# Patient Record
Sex: Female | Born: 1995 | Race: White | Hispanic: No | Marital: Single | Smoking: Never smoker
Health system: Southern US, Community
[De-identification: ages and names within clinical notes are randomized; demographics above are authoritative.]

---
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
## 2015-01-17 DIAGNOSIS — M722 Plantar fascial fibromatosis: Secondary | ICD-10-CM | POA: Diagnosis not present

## 2015-05-05 DIAGNOSIS — J069 Acute upper respiratory infection, unspecified: Secondary | ICD-10-CM | POA: Diagnosis not present

## 2015-12-30 ENCOUNTER — Encounter: Payer: Self-pay | Admitting: Family Medicine

## 2015-12-30 ENCOUNTER — Ambulatory Visit (INDEPENDENT_AMBULATORY_CARE_PROVIDER_SITE_OTHER): Payer: BLUE CROSS/BLUE SHIELD | Admitting: Family Medicine

## 2015-12-30 VITALS — BP 90/52 | HR 70 | Temp 96.3°F | Resp 14

## 2015-12-30 DIAGNOSIS — R5383 Other fatigue: Secondary | ICD-10-CM

## 2015-12-30 NOTE — Progress Notes (Signed)
Patient presents today with two episodes of exhaustion that brought patient to her knees in the last two races with cross-country. She has not ran in a race since high school. She did not compete last year because of a plantar fascia injury. Patient did not lose consciousness. The first episode there was not a physician to evaluate the patient however the second episode there was. Patient states that the first episode was an intense race. She has never ran a race like that before. She was about 100 m from the finish line when she states she started to hyperventilate and felt exhaustion. She was unable to complete the race. She believes a second episode involved a race that was longer and she felt short of the finish line by 1 kilometer. She also did not lose consciousness with this episode. The physicians there evaluated her and stated that she had some minimal wheezing on exam. Patient typically uses her inhaler before practices and races. Her vitals were stable. Patient was able to walk after 5 minutes after both episodes. She had no lasting problems after this. She denied any chest pain, dizziness, headache, increased shortness of breath during the episodes. She denies any lower extremity swelling or pain. She states that she had no similar episodes in practice ever before or in high school running cross-country. She does not eat much before race and has always done so. She drinks 0-calorie electrolyte beverages. She would say that she gets slightly anxious during the race. She has started to talk to a sports psychologist on campus which she states has helped. She denies any family history of sudden cardiac death. She denies any illicit drug use. She denies using any supplements.  ROS: Negative except mentioned above.  GENERAL: NAD RESP: CTA B CARD: RRR NEURO: CN II-XII grossly intact   ECG - NSR, no ST elevation or depressions, Pwaves present  A/P: Exhaustion during races causing collapse -not truly  syncopal episode was described, I would recommend optimizing her diet prior to racing, would continue seeing the sports psychologist, would also encourage drinking electrolyte beverages with calories, EKG today in office was reassuring showing normal sinus rhythm. Given the fact that she has never had an episode like this before in high school or during practices I would continue with the plan above. If she has another episode in practice or during a race I will refer her to cardiology for further workup. Patient addresses understanding of plan.

## 2016-01-27 ENCOUNTER — Other Ambulatory Visit: Payer: Self-pay | Admitting: Family Medicine

## 2016-12-06 ENCOUNTER — Encounter: Payer: Self-pay | Admitting: Family Medicine

## 2016-12-06 ENCOUNTER — Ambulatory Visit
Admission: RE | Admit: 2016-12-06 | Discharge: 2016-12-06 | Disposition: A | Discharge status: Home / Self Care | Payer: BLUE CROSS/BLUE SHIELD | Source: Ambulatory Visit | Attending: Family Medicine | Admitting: Family Medicine

## 2016-12-06 ENCOUNTER — Ambulatory Visit (INDEPENDENT_AMBULATORY_CARE_PROVIDER_SITE_OTHER): Payer: BLUE CROSS/BLUE SHIELD | Admitting: Family Medicine

## 2016-12-06 DIAGNOSIS — M79671 Pain in right foot: Secondary | ICD-10-CM | POA: Diagnosis not present

## 2016-12-06 MED ORDER — NAPROXEN 500 MG PO TABS: 500.0000 mg | ORAL_TABLET | Freq: Two times a day (BID) | ORAL | 0 refills | Status: DC

## 2016-12-06 NOTE — Progress Notes (Signed)
Patient presents with right ankle/heel pain for about 3 weeks. She states that her mileage hasn't really gone up much since the summer. What has changed is the uneven terrain she has been running on since she got back to school. She has been changing out her running shoes every 300-400 miles. She is currently up to 7760miles/week. Her most recent Vitamin D level was normal this summer. She is currently not taking a Vitamin D supplement. She has had some pain at rest. She has been doing stretching and scraping with PT.   ROS: Negative except mentioned above. Vitals as per Epic.  GENERAL: NAD RESP: CTA B CARD: RRR MSK: R Ankle/Foot - no ecchymosis or obvious swelling of ankle/foot, FROM, no significant tenderness of peroneal tendon appreciated, mild tenderness along lateral aspect of calcaneus, mild tenderness with squeeze, mild tenderness with heel walk and hopping, nv intact  NEURO: CN II-XII grossly intact  A/P: R foot pain - will do xrays and MRI to further look for stress injury, walking boot for now, if pain with weightbearing in boot then on crutches, cross-training if no pain, Naprosyn prn, seek medical attention if symptoms persist or worsen.

## 2016-12-07 ENCOUNTER — Other Ambulatory Visit: Payer: Self-pay | Admitting: Family Medicine

## 2016-12-07 DIAGNOSIS — M79671 Pain in right foot: Secondary | ICD-10-CM

## 2016-12-09 ENCOUNTER — Ambulatory Visit: Admission: RE | Admit: 2016-12-09 | Payer: BLUE CROSS/BLUE SHIELD | Source: Ambulatory Visit

## 2016-12-13 ENCOUNTER — Ambulatory Visit (INDEPENDENT_AMBULATORY_CARE_PROVIDER_SITE_OTHER): Payer: BLUE CROSS/BLUE SHIELD | Admitting: Family Medicine

## 2016-12-13 DIAGNOSIS — F4329 Adjustment disorder with other symptoms: Secondary | ICD-10-CM
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
## 2017-01-17 ENCOUNTER — Other Ambulatory Visit: Payer: Self-pay | Admitting: Family Medicine

## 2017-01-17 NOTE — Progress Notes (Signed)
Refilled Naprosyn 

## 2017-01-31 ENCOUNTER — Other Ambulatory Visit: Payer: Self-pay | Admitting: Family Medicine

## 2017-05-24 ENCOUNTER — Ambulatory Visit
Admission: RE | Admit: 2017-05-24 | Discharge: 2017-05-24 | Disposition: A | Discharge status: Home / Self Care | Payer: BLUE CROSS/BLUE SHIELD | Source: Ambulatory Visit | Attending: Family Medicine | Admitting: Family Medicine

## 2017-05-24 ENCOUNTER — Ambulatory Visit (INDEPENDENT_AMBULATORY_CARE_PROVIDER_SITE_OTHER): Payer: BLUE CROSS/BLUE SHIELD | Admitting: Family Medicine

## 2017-05-24 DIAGNOSIS — M79672 Pain in left foot: Secondary | ICD-10-CM | POA: Insufficient documentation

## 2017-05-24 MED ORDER — DICLOFENAC SODIUM 75 MG PO TBEC: 75.0000 mg | DELAYED_RELEASE_TABLET | Freq: Two times a day (BID) | ORAL | 0 refills | Status: AC

## 2017-05-24 NOTE — Progress Notes (Signed)
Patient presents today with symptoms of left plantar fascia/heel pain since January. Patient states that she started to feel the pain after a hard run in new shoes in January. She denies any one particular incident or trauma. She has been treated by the athletic trainer and PT with a walking boot, night splints, shoe insert, dry needling, stretching. She has had some relief with these things but continues to have pain with running. She has minimal pain with walking. She does have pain when she steps out of bed in the morning however not as significant as after she has ran. She has not taken any anti-inflammatory medication consistently. A few years ago she did have a right partial plantar fascia tear. She denies any sudden increase in mileage. She was up to about 50 miles a week prior to this injury. She denies any restrictive eating behavior or problems with her menstrual cycle. Her last vitamin D level was 55 in 11/2016. She does take vitamin D supplements.  ROS: Negative except mentioned above. Vitals as per Epic. GENERAL: NAD MSK: Left foot - large callused area along the plantar surface forefoot area, mild tenderness at the insertion of the plantar fascia on the calcaneus, there is more tenderness distal to this moving towards the arch, no significant ecchymosis or swelling, full range of motion, NV intact NEURO: CN II-XII grossly intact   A/P: Left heel pain - will get x-rays and then do MRI, will continue wearing night splints, cross training only as tolerated, no running for now, walking boot if pain with walking and tendon issue, heel cups discussed, ice area, oral Voltaren twice daily prescribed, will discuss further plan of care after imaging results have been reviewed.

## 2017-05-30 ENCOUNTER — Other Ambulatory Visit: Payer: Self-pay | Admitting: Family Medicine

## 2017-05-30 DIAGNOSIS — G8929 Other chronic pain: Secondary | ICD-10-CM

## 2017-05-30 DIAGNOSIS — M79672 Pain in left foot: Principal | ICD-10-CM

## 2017-06-01 ENCOUNTER — Ambulatory Visit
Admission: RE | Admit: 2017-06-01 | Discharge: 2017-06-01 | Disposition: A | Discharge status: Home / Self Care | Payer: BLUE CROSS/BLUE SHIELD | Source: Ambulatory Visit | Attending: Family Medicine | Admitting: Family Medicine

## 2017-06-01 DIAGNOSIS — G8929 Other chronic pain: Secondary | ICD-10-CM

## 2017-06-01 DIAGNOSIS — M79672 Pain in left foot: Secondary | ICD-10-CM

## 2017-06-01 DIAGNOSIS — S96912A Strain of unspecified muscle and tendon at ankle and foot level, left foot, initial encounter: Secondary | ICD-10-CM | POA: Insufficient documentation

## 2017-06-01 DIAGNOSIS — M65872 Other synovitis and tenosynovitis, left ankle and foot: Secondary | ICD-10-CM | POA: Insufficient documentation

## 2017-06-01 DIAGNOSIS — R600 Localized edema: Secondary | ICD-10-CM | POA: Insufficient documentation

## 2017-06-01 DIAGNOSIS — X58XXXA Exposure to other specified factors, initial encounter: Secondary | ICD-10-CM | POA: Insufficient documentation

## 2017-06-02 ENCOUNTER — Encounter: Payer: Self-pay | Admitting: Family Medicine

## 2017-06-02 ENCOUNTER — Ambulatory Visit (INDEPENDENT_AMBULATORY_CARE_PROVIDER_SITE_OTHER): Payer: BLUE CROSS/BLUE SHIELD | Admitting: Family Medicine

## 2017-06-02 DIAGNOSIS — M79672 Pain in left foot: Secondary | ICD-10-CM

## 2017-06-02 NOTE — Progress Notes (Signed)
Patient presents today for follow-up regarding her left heel pain. Patient is in a walking boot. She denies any increase in her pain and the walking boot. Reviewed results of the left foot MRI. Patient has no other pain besides in the heel. There are some other findings on the MRI however patient does not have any pain in any of those areas.  Vitals as per Epic Refer to previous exam.   A/P: Left partial plantar fascia tear - discussed with patient the treatment of being in the boot for 4-6 weeks, patient will follow up with me every 2 weeks and will advance cross training activities once pain is reduced/gone. Patient addresses understanding of this. She will not be competing the remainder of this track season since she will not have enough time to train. Can use ice on the area and NSAIDs when necessary. Seek medical attention if any further problems or questions. Will discuss above with trainer.

## 2017-07-07 ENCOUNTER — Ambulatory Visit (INDEPENDENT_AMBULATORY_CARE_PROVIDER_SITE_OTHER): Payer: BLUE CROSS/BLUE SHIELD | Admitting: Family Medicine

## 2017-07-07 ENCOUNTER — Encounter: Payer: Self-pay | Admitting: Family Medicine

## 2017-07-07 DIAGNOSIS — M79672 Pain in left foot: Secondary | ICD-10-CM

## 2017-07-07 NOTE — Progress Notes (Signed)
Patient presents today for follow-up regarding her left plantar fascia partial tear. Patient had an MRI 5 weeks ago which showed these findings. She transitioned out of the walking boot to a tennis shoe a few days ago. She has been swimming without any problems and recently implemented stationary bike. She feels slight soreness in the morning when she gets out of bed which lasts only a minute. She denies any swelling or bruising of the area.   ROS: Negative except mentioned above. Vitals as per Epic. GENERAL: NAD MSK: Left Heel - no significant swelling or bruising of the area, minimal tenderness to palpation at medial aspect of the plantar fascia's insertion at calcaneus, full range of motion, NV intact NEURO: CN II-XII grossly intact   A/P: Left partial plantar fascia tear - patient is progressing well, will continue to do cross training and will introduce elliptical next week, if any increase of pain will reduce activity, patient does plan on working with a physical therapist when she has home over the summer, encourage patient to discuss any changes in her running form or shoewear with coach and PT, patient addresses understanding and will seek medical attention as needed.

## 2018-02-06 ENCOUNTER — Ambulatory Visit (INDEPENDENT_AMBULATORY_CARE_PROVIDER_SITE_OTHER): Payer: BLUE CROSS/BLUE SHIELD | Admitting: Family Medicine

## 2018-02-06 ENCOUNTER — Encounter: Payer: Self-pay | Admitting: Family Medicine

## 2018-02-06 ENCOUNTER — Ambulatory Visit
Admission: RE | Admit: 2018-02-06 | Discharge: 2018-02-06 | Disposition: A | Discharge status: Home / Self Care | Payer: BLUE CROSS/BLUE SHIELD | Source: Ambulatory Visit | Attending: Family Medicine | Admitting: Family Medicine

## 2018-02-06 ENCOUNTER — Other Ambulatory Visit: Payer: Self-pay | Admitting: Family Medicine

## 2018-02-06 VITALS — Resp 14

## 2018-02-06 DIAGNOSIS — M79671 Pain in right foot: Secondary | ICD-10-CM | POA: Diagnosis present

## 2018-02-06 DIAGNOSIS — R52 Pain, unspecified: Secondary | ICD-10-CM

## 2018-02-06 DIAGNOSIS — M7989 Other specified soft tissue disorders: Secondary | ICD-10-CM | POA: Insufficient documentation

## 2018-02-06 DIAGNOSIS — R609 Edema, unspecified: Secondary | ICD-10-CM

## 2018-02-06 DIAGNOSIS — M84374A Stress fracture, right foot, initial encounter for fracture: Secondary | ICD-10-CM

## 2018-02-07 ENCOUNTER — Other Ambulatory Visit: Payer: Self-pay | Admitting: Family Medicine

## 2018-02-07 DIAGNOSIS — M84374A Stress fracture, right foot, initial encounter for fracture: Secondary | ICD-10-CM

## 2018-02-07 LAB — VITAMIN D 25 HYDROXY (VIT D DEFICIENCY, FRACTURES): VIT D 25 HYDROXY: 108 ng/mL — AB (ref 30.0–100.0)

## 2018-02-07 NOTE — Progress Notes (Signed)
Patient presents today with symptoms of right foot pain.  Patient states that her symptoms started about 2 weeks ago.  She denies any symptoms prior to this.  She felt soreness of her foot after a run.  She not ran since that time.  She noticed yesterday that she had increased pain when going to the grocery store.  She has noticed some swelling since that time.  She denies any bruising.  She denies any other trauma or injury to the foot.  She admits to normal menstrual cycles and no significant weight gain or loss.  She admits to normal eating behaviors.  Her vitamin D has always been normal.  She does take a multivitamin that does have vitamin D in it.  She admits to changing her shoes every 400 miles. She went to get an x-ray earlier today.  ROS: Negative except mentioned above. Vitals as per Epic. GENERAL: NAD MSK: Right foot -minimal swelling noted along the distal aspect of the foot, no ecchymosis appreciated, tenderness along the distal fourth metatarsal, full range of motion, N/V intact NEURO: CN II-XII grossly intact   A/P: Stress injury right foot -there does appear to be some cortical irregularity noted on the x-ray along the distal fourth metatarsal, this is the location of her pain, would asked that patient to follow-up with Dr. Ardine Engiehl today since he is on campus, stay in the walking boot for now, will order MRI if Dr. Ardine Engiehl feels it is necessary.  Vitamin D level checked today.

## 2018-02-09 ENCOUNTER — Ambulatory Visit
Admission: RE | Admit: 2018-02-09 | Discharge: 2018-02-09 | Disposition: A | Discharge status: Home / Self Care | Payer: BLUE CROSS/BLUE SHIELD | Source: Ambulatory Visit | Attending: Family Medicine | Admitting: Family Medicine

## 2018-02-09 DIAGNOSIS — X58XXXA Exposure to other specified factors, initial encounter: Secondary | ICD-10-CM | POA: Insufficient documentation

## 2018-02-09 DIAGNOSIS — M84374A Stress fracture, right foot, initial encounter for fracture: Secondary | ICD-10-CM | POA: Insufficient documentation

## 2018-04-03 ENCOUNTER — Encounter: Payer: Self-pay | Admitting: Family Medicine

## 2018-04-03 ENCOUNTER — Ambulatory Visit (INDEPENDENT_AMBULATORY_CARE_PROVIDER_SITE_OTHER): Payer: BLUE CROSS/BLUE SHIELD | Admitting: Family Medicine

## 2018-04-03 VITALS — Temp 98.3°F | Resp 14

## 2018-04-03 DIAGNOSIS — M84374D Stress fracture, right foot, subsequent encounter for fracture with routine healing: Secondary | ICD-10-CM

## 2018-04-04 ENCOUNTER — Other Ambulatory Visit: Payer: Self-pay | Admitting: Family Medicine

## 2018-04-04 DIAGNOSIS — M84376D Stress fracture, unspecified foot, subsequent encounter for fracture with routine healing: Secondary | ICD-10-CM

## 2018-04-04 NOTE — Progress Notes (Signed)
Patient presents today for follow-up regarding her right metatarsal stress injury. Patient states that it has been 8 weeks since her diagnosis.  She admits to coming out of the walking boot approximately 2 weeks ago.  She has had no pain walking around in normal shoes.  She denies any swelling.  She has been crosstraining with stationary bike and swimming without difficulty.  She has walked with her family over winter break without problems.  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  She will be graduating in May.  She would however like to get back to running recreationally.  Her vitamin D level was checked a few months ago and was high.  She was advised to decrease her vitamin D supplement.  Patient does wear orthotics.  Patient admits to changing out her running shoes every 400 miles.  She denies any disordered eating.  She admits to normal menses.  ROS: Negative except mentioned above. Vitals as per Epic. GENERAL: NAD MSK: Feet - no obvious deformity, full range of motion, no tenderness to palpation, N/V intact, negative hop test  NEURO: CN II-XII grossly intact   A/P: Right foot stress injury - I have recommended that patient see PT and AT and come up with a plan to increase her activity level as patient wishes to return to running recreationally, would recommend that she has crosstraining days and days off built in to her week, patient is to advance as tolerated and not to increase her running by any more than 10% a week, seek medical attention if any further problems arise, patient addresses understanding and is appreciative.  Patient is continuing to see counseling services regarding her feelings of not returning to her sport for her final season at [employer].  She states that this is helping her cope.  She denies any suicidal or homicidal ideations at this time.

## 2018-04-14 ENCOUNTER — Ambulatory Visit
Admission: RE | Admit: 2018-04-14 | Discharge: 2018-04-14 | Disposition: A | Discharge status: Home / Self Care | Payer: BLUE CROSS/BLUE SHIELD | Attending: Family Medicine | Admitting: Family Medicine

## 2018-04-14 ENCOUNTER — Ambulatory Visit
Admission: RE | Admit: 2018-04-14 | Discharge: 2018-04-14 | Disposition: A | Discharge status: Home / Self Care | Payer: BLUE CROSS/BLUE SHIELD | Source: Ambulatory Visit | Attending: Family Medicine | Admitting: Family Medicine

## 2018-04-14 DIAGNOSIS — M84376D Stress fracture, unspecified foot, subsequent encounter for fracture with routine healing: Secondary | ICD-10-CM

## 2018-04-14 DIAGNOSIS — M79671 Pain in right foot: Secondary | ICD-10-CM | POA: Insufficient documentation

## 2019-03-03 IMAGING — CR DG OS CALCIS 2+V*R*
1 series · 2 of 2 positions shown · non-contrast
Comparison: Right foot radiographs 12/06/2014 and right foot MRI
12/18/2014

CLINICAL DATA: Right heel pain for 3 weeks.  Cross-country runner.

EXAM:
RIGHT OS CALCIS - 2+ VIEW

[Series 1: dg os calcis right · 0.14mm/px · 2 of 2 slices shown]
[im 1/2]
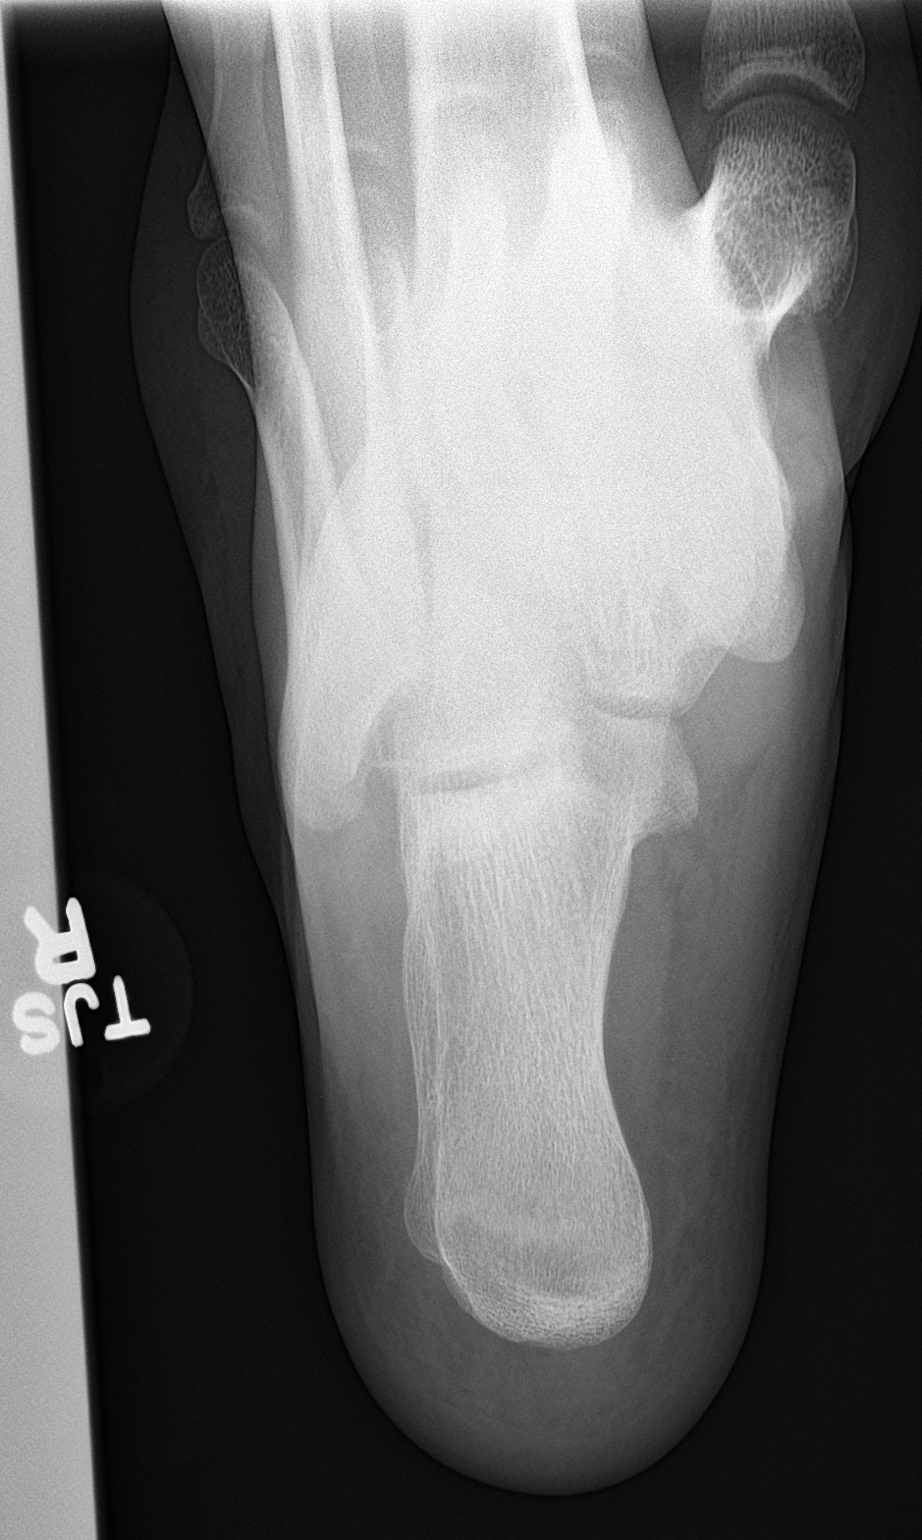
[im 2/2]
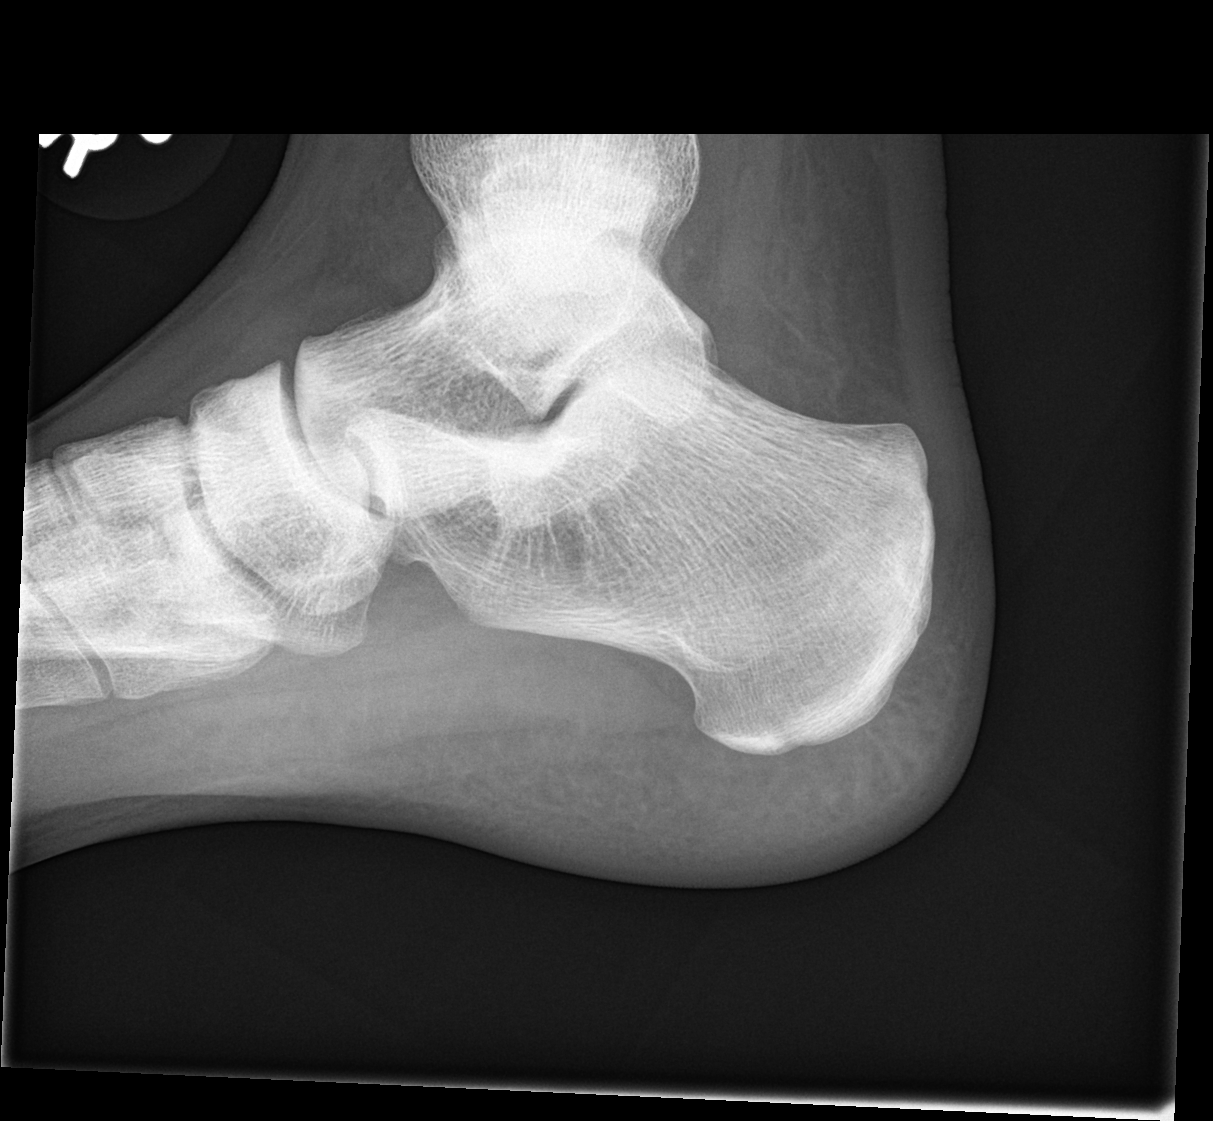

[2 of 2 positions shown; findings below may reference images not displayed]

FINDINGS: There is no evidence of fracture. No focal osseous lesion or osseous
erosion is seen. Bone mineralization appears normal. Soft tissues
are unremarkable.
IMPRESSION: Negative.

## 2020-12-27 ENCOUNTER — Emergency Department
Admission: EM | Admit: 2020-12-27 | Discharge: 2020-12-27 | Disposition: A | Discharge status: Home / Self Care | Payer: BLUE CROSS/BLUE SHIELD | Attending: Emergency Medicine | Admitting: Emergency Medicine

## 2020-12-27 DIAGNOSIS — S61002A Unspecified open wound of left thumb without damage to nail, initial encounter: Secondary | ICD-10-CM | POA: Insufficient documentation

## 2020-12-27 DIAGNOSIS — S61209A Unspecified open wound of unspecified finger without damage to nail, initial encounter: Secondary | ICD-10-CM

## 2020-12-27 DIAGNOSIS — W458XXA Other foreign body or object entering through skin, initial encounter: Secondary | ICD-10-CM | POA: Insufficient documentation

## 2020-12-27 DIAGNOSIS — Y92009 Unspecified place in unspecified non-institutional (private) residence as the place of occurrence of the external cause: Secondary | ICD-10-CM | POA: Insufficient documentation

## 2020-12-27 MED ORDER — GELATIN ABSORBABLE 12-7 MM EX MISC
1.0000 | Freq: Once | CUTANEOUS | Status: DC
Start: 2020-12-27 — End: 2020-12-27

## 2020-12-27 MED ORDER — LIDOCAINE HCL 1 % IJ SOLN
20.0000 mL | Freq: Once | INTRAMUSCULAR | Status: AC
Start: 2020-12-27 — End: 2020-12-27
  Administered 2020-12-27: 14:00:00 20 mL via SUBCUTANEOUS
  Filled 2020-12-27: qty 20

## 2020-12-27 NOTE — ED Provider Notes (Signed)
EMERGENCY DEPARTMENT HISTORY AND PHYSICAL EXAM     None        Date: 12/27/2020  Patient Name: Monique Ramirez  Attending Physician: Maryella Shivers, MD  Advanced Practice Provider: Rexene Alberts, PA-C    History of Presenting Illness       History Provided By: Patient  Interpreter: None required     Chief Complaint:   Laceration        HPI: Monique Ramirez is a 25 y.o. female presenting to the ED with a laceration of the left thumb fingertip that occurred 1.5 hours ago. Mechanism of injury was patient accidentally cut the tip of her finger while chopping something at home.  Patient denies numbness and weakness. Relevant history affecting wound healing: none. Patient's Tetanus vaccine up to date (within 10 years).     There were no other injuries.    PCP: Pcp, None, MD  SPECIALISTS:    No current facility-administered medications for this encounter.    Current Outpatient Medications:     norethindrone-ethinyl estradiol-Fe (Lo Loestrin Fe) 1 MG-10 MCG / 10 MCG Tab, Lo Loestrin Fe 1 mg-10 mcg (24)/10 mcg (2) tablet, Disp: , Rfl:     Past History     Past Medical History:  History reviewed. No pertinent past medical history.    Past Surgical History:  History reviewed. No pertinent surgical history.    Family History:  History reviewed. No pertinent family history.    Social History:  Social History     Tobacco Use    Smoking status: Never    Smokeless tobacco: Never   Vaping Use    Vaping Use: Never used   Substance Use Topics    Alcohol use: Never    Drug use: Never       Allergies:  No Known Allergies    Review of Systems     Review of Systems   Skin:      Positive for laceration. Negative for color change.  Neurological: Negative for tingling, sensory change.      Physical Exam     Vitals:    12/27/20 1225   BP: 118/73   Pulse: (!) 57   Resp: 16   Temp: 98.6 F (37 C)   TempSrc: Oral   SpO2: 100%   Weight: 60.2 kg   Height: 5\' 6"  (1.676 m)     Pulse Oximetry Analysis - Normal SpO2: 100 % on RA    Physical  Exam  Vitals and nursing note reviewed.   Constitutional:       General: She is not in acute distress.     Appearance: Normal appearance.   HENT:      Head: Normocephalic and atraumatic.      Nose: Nose normal.   Eyes:      General:         Right eye: No discharge.         Left eye: No discharge.      Conjunctiva/sclera: Conjunctivae normal.   Cardiovascular:      Rate and Rhythm: Normal rate.      Pulses: Normal pulses.      Comments: Left radial pulse 2+  Pulmonary:      Effort: Pulmonary effort is normal. No respiratory distress.      Breath sounds: Normal breath sounds.   Musculoskeletal:         General: No deformity. Normal range of motion.      Cervical back: Normal  range of motion and neck supple.      Comments: 0.5 cm avulsion of left tip of thumb with no active bleeding. No FB. FROM of left thumb.    Skin:     General: Skin is warm and dry.   Neurological:      Mental Status: She is alert and oriented to person, place, and time. Mental status is at baseline.      Comments: Sensation intact distally left thumb.          Diagnostic Study Results     Labs -  Lab Results    None         Radiologic Studies -   Radiology Results (24 Hour)       ** No results found for the last 24 hours. **        .      Medical Decision Making     I reviewed the vital signs, available nursing notes, past medical history, past surgical history, family history and social history.    Prior Records: Nursing notes.     Procedures:   Lac Repair    Date/Time: 12/27/2020 9:51 PM  Performed by: Rexene Alberts, PA  Authorized by: Maryella Shivers, MD     Consent:     Consent obtained:  Verbal    Consent given by:  Patient    Risks discussed:  Infection, need for additional repair, poor cosmetic result, pain and poor wound healing  Anesthesia:     Anesthesia method:  Local infiltration    Local anesthetic:  Lidocaine 1% w/o epi  Laceration details:     Location:  Finger    Finger location:  L thumb    Length (cm):  0.5  Pre-procedure  details:     Preparation:  Patient was prepped and draped in usual sterile fashion  Exploration:     Hemostasis achieved with:  Direct pressure    Wound extent: no foreign bodies/material noted, no muscle damage noted, no nerve damage noted, no tendon damage noted and no underlying fracture noted      Contaminated: no    Treatment:     Area cleansed with:  Saline    Amount of cleaning:  Standard    Irrigation solution:  Sterile saline    Irrigation method:  Pressure wash    Visualized foreign bodies/material removed: no    Skin repair:     Repair method: gel foam.  Approximation:     Approximation:  Loose  Repair type:     Repair type:  Simple  Post-procedure details:     Dressing:  Bulky dressing    Procedure completion:  Tolerated well, no immediate complications      ED Medications  Medications   lidocaine (XYLOCAINE) 1 % injection 20 mL (20 mLs Subcutaneous Given by Other 12/27/20 1347)       ED Course:       Provider Notes:   Avulsion of left thumb fingertip. NVI. Wound fully explored. X-Ray was not performed.  Repaired with gel foam. Removal not required. Tetanus booster was not indicated at this time. Antibiotics not indicated at this time.    Wound care instructions were given to patient including but not limited to: return for redness, swelling, purulent discharge, continued or worsening numbness or tinging.  These instructions were understood and the patient promised to return for worsening condition.      Diagnosis     Clinical Impression:   1. Avulsion  of fingertip, initial encounter        Treatment Plan:   ED Disposition       ED Disposition   Discharge    Condition   --    Date/Time   Sat Dec 27, 2020  1:40 PM    Comment   Monique Ramirez discharge to home/self care.    Condition at disposition: Stable                   _______________________________    CHART OWNERSHIP: I, Khylen Riolo A Vinita Prentiss, PA-C, am the primary clinician of record.  _______________________________         Rexene Alberts, PA  12/27/20  2152       Maryella Shivers, MD  12/28/20 [phone]

## 2020-12-27 NOTE — Discharge Instructions (Signed)
Remove the tube gauze dressing in 24 hours. You will see gelfoam attached . Leave the gelfoam in place x 5-7 days. Do not get wound wet. Please inspect around the wound. If you notice any foul smelling drainage, redness, redness moving up the arm, worsening pain please return to ER. If gelfoam and steri strips still present after 7 days you can gently pull it off. The gelfoam will dissolve into the wound or fall off on its own when skin underneath has healed.

## 2020-12-27 NOTE — ED Triage Notes (Signed)
AVULSION TO LEFT THUMB WITH KNIFE THIS MORNING.  NO ACTIVE BLEEDING. TETANUS UTD.
